# Patient Record
Sex: Female | Born: 1980 | Race: Black or African American | Hispanic: No | Marital: Single | Smoking: Current every day smoker
Health system: Southern US, Community
[De-identification: ages and names within clinical notes are randomized; demographics above are authoritative.]

## PROBLEM LIST (undated history)

## (undated) DIAGNOSIS — L98499 Non-pressure chronic ulcer of skin of other sites with unspecified severity: Secondary | ICD-10-CM

---

## 2015-07-07 ENCOUNTER — Encounter (HOSPITAL_COMMUNITY): Payer: Self-pay | Admitting: Emergency Medicine

## 2015-07-07 ENCOUNTER — Emergency Department (HOSPITAL_COMMUNITY): Payer: BLUE CROSS/BLUE SHIELD

## 2015-07-07 ENCOUNTER — Emergency Department (HOSPITAL_COMMUNITY)
Admission: EM | Admit: 2015-07-07 | Discharge: 2015-07-07 | Disposition: A | Discharge status: Home / Self Care | Payer: BLUE CROSS/BLUE SHIELD | Attending: Emergency Medicine | Admitting: Emergency Medicine

## 2015-07-07 DIAGNOSIS — J0100 Acute maxillary sinusitis, unspecified: Secondary | ICD-10-CM | POA: Insufficient documentation

## 2015-07-07 DIAGNOSIS — B349 Viral infection, unspecified: Secondary | ICD-10-CM | POA: Insufficient documentation

## 2015-07-07 DIAGNOSIS — F17200 Nicotine dependence, unspecified, uncomplicated: Secondary | ICD-10-CM | POA: Insufficient documentation

## 2015-07-07 DIAGNOSIS — E860 Dehydration: Secondary | ICD-10-CM | POA: Insufficient documentation

## 2015-07-07 DIAGNOSIS — Z3202 Encounter for pregnancy test, result negative: Secondary | ICD-10-CM | POA: Insufficient documentation

## 2015-07-07 DIAGNOSIS — R05 Cough: Secondary | ICD-10-CM | POA: Diagnosis present

## 2015-07-07 LAB — COMPREHENSIVE METABOLIC PANEL
ALT: 18 U/L (ref 14–54)
ANION GAP: 8 (ref 5–15)
AST: 20 U/L (ref 15–41)
Albumin: 3.6 g/dL (ref 3.5–5.0)
Alkaline Phosphatase: 48 U/L (ref 38–126)
BUN: 7 mg/dL (ref 6–20)
CHLORIDE: 107 mmol/L (ref 101–111)
CO2: 23 mmol/L (ref 22–32)
Calcium: 9.2 mg/dL (ref 8.9–10.3)
Creatinine, Ser: 0.59 mg/dL (ref 0.44–1.00)
Glucose, Bld: 97 mg/dL (ref 65–99)
Potassium: 3.7 mmol/L (ref 3.5–5.1)
SODIUM: 138 mmol/L (ref 135–145)
Total Bilirubin: 0.3 mg/dL (ref 0.3–1.2)
Total Protein: 7.2 g/dL (ref 6.5–8.1)

## 2015-07-07 LAB — URINALYSIS, ROUTINE W REFLEX MICROSCOPIC
BILIRUBIN URINE: NEGATIVE
GLUCOSE, UA: NEGATIVE mg/dL
KETONES UR: NEGATIVE mg/dL
LEUKOCYTES UA: NEGATIVE
Nitrite: NEGATIVE
PROTEIN: NEGATIVE mg/dL
Specific Gravity, Urine: 1.026 (ref 1.005–1.030)
pH: 6 (ref 5.0–8.0)

## 2015-07-07 LAB — POC URINE PREG, ED: PREG TEST UR: NEGATIVE

## 2015-07-07 LAB — CBC
HCT: 36.2 % (ref 36.0–46.0)
HEMOGLOBIN: 11.9 g/dL — AB (ref 12.0–15.0)
MCH: 28.2 pg (ref 26.0–34.0)
MCHC: 32.9 g/dL (ref 30.0–36.0)
MCV: 85.8 fL (ref 78.0–100.0)
Platelets: 282 10*3/uL (ref 150–400)
RBC: 4.22 MIL/uL (ref 3.87–5.11)
RDW: 13.4 % (ref 11.5–15.5)
WBC: 4.7 10*3/uL (ref 4.0–10.5)

## 2015-07-07 LAB — LIPASE, BLOOD: LIPASE: 23 U/L (ref 11–51)

## 2015-07-07 LAB — RAPID STREP SCREEN (MED CTR MEBANE ONLY): STREPTOCOCCUS, GROUP A SCREEN (DIRECT): NEGATIVE

## 2015-07-07 LAB — URINE MICROSCOPIC-ADD ON

## 2015-07-07 MED ORDER — KETOROLAC TROMETHAMINE 30 MG/ML IJ SOLN
30.0000 mg | Freq: Once | INTRAMUSCULAR | Status: AC
  Administered 2015-07-07: 30 mg via INTRAVENOUS
  Filled 2015-07-07: qty 1

## 2015-07-07 MED ORDER — ONDANSETRON HCL 4 MG/2ML IJ SOLN
4.0000 mg | Freq: Once | INTRAMUSCULAR | Status: AC
  Administered 2015-07-07: 4 mg via INTRAVENOUS
  Filled 2015-07-07: qty 2

## 2015-07-07 MED ORDER — METOCLOPRAMIDE HCL 10 MG PO TABS: 10.0000 mg | ORAL_TABLET | Freq: Four times a day (QID) | ORAL | Status: AC | PRN

## 2015-07-07 MED ORDER — FLUTICASONE PROPIONATE 50 MCG/ACT NA SUSP
2.0000 | Freq: Every day | NASAL | Status: AC
Start: 2015-07-07 — End: ?

## 2015-07-07 MED ORDER — SODIUM CHLORIDE 0.9 % IV BOLUS (SEPSIS)
1000.0000 mL | Freq: Once | INTRAVENOUS | Status: AC
  Administered 2015-07-07: 1000 mL via INTRAVENOUS

## 2015-07-07 MED ORDER — AMOXICILLIN 500 MG PO CAPS: 1000.0000 mg | ORAL_CAPSULE | Freq: Two times a day (BID) | ORAL | Status: AC

## 2015-07-07 NOTE — ED Notes (Addendum)
Pt. presents with multiple complaints : reports intermittent frontal headache , productive cough , emesis , diarrhea , generalized abdominal pain, bilateral legs aching and tingling , chills and fever.

## 2015-07-07 NOTE — ED Provider Notes (Signed)
CSN: [id number]     Arrival date & time 07/07/15  [id number] History   First MD Initiated Contact with Patient 07/07/15 [phone]     Chief Complaint  Patient presents with  . Headache  . Cough  . Emesis  . Diarrhea  . Abdominal Pain     (Consider location/radiation/quality/duration/timing/severity/associated sxs/prior Treatment) The history is provided by the patient.   Elizabeth Lyons is a 35 y.o. female here with fever, chills, myalgias. She states that she had fever and chills starting yesterday. Has temperature 102 yesterday. Also has productive cough as well as sinus congestion. She states that she has yellowish greenish nasal drainage as well. She says she has a lot of frontal headaches and this is similar to her previous sinusitis. Had an episode of vomiting yesterday as well as some diarrhea. She states that her colleagues are sick at work as well. She just moved here from [place] but has no medical problems.   History reviewed. No pertinent past medical history. History reviewed. No pertinent past surgical history. No family history on file. Social History  Substance Use Topics  . Smoking status: Current Every Day Smoker  . Smokeless tobacco: None  . Alcohol Use: Yes   OB History    No data available     Review of Systems  Respiratory: Positive for cough.   Gastrointestinal: Positive for vomiting, abdominal pain and diarrhea.  Neurological: Positive for headaches.  All other systems reviewed and are negative.     Allergies  Review of patient's allergies indicates no known allergies.  Home Medications   Prior to Admission medications   Not on File   BP 128/91 mmHg  Pulse 88  Temp(Src) 98.8 F (37.1 C) (Oral)  Resp 16  Ht 5\' 9"  (1.753 m)  Wt 175 lb (79.379 kg)  BMI 25.83 kg/m2  SpO2 99%  LMP 06/06/2015 Physical Exam  Constitutional: She is oriented to person, place, and time.  Dehydrated   HENT:  Head: Normocephalic.  MM slightly dry. + maxillary  sinus tenderness, no obvious purulent discharge from nose   Eyes: Conjunctivae are normal. Pupils are equal, round, and reactive to light.  Neck: Normal range of motion. Neck supple.  Cardiovascular: Normal rate, regular rhythm and normal heart sounds.   Pulmonary/Chest: Effort normal and breath sounds normal.  Abdominal: Soft. Bowel sounds are normal. She exhibits no distension. There is no tenderness. There is no rebound.  Musculoskeletal: Normal range of motion.  Neurological: She is alert and oriented to person, place, and time.  Skin: Skin is warm.  Psychiatric: She has a normal mood and affect. Her behavior is normal. Thought content normal.  Nursing note and vitals reviewed.   ED Course  Procedures (including critical care time) Labs Review Labs Reviewed  CBC - Abnormal; Notable for the following:    Hemoglobin 11.9 (*)    All other components within normal limits  URINALYSIS, ROUTINE W REFLEX MICROSCOPIC (NOT AT [facility]) - Abnormal; Notable for the following:    APPearance HAZY (*)    Hgb urine dipstick TRACE (*)    All other components within normal limits  URINE MICROSCOPIC-ADD ON - Abnormal; Notable for the following:    Squamous Epithelial / LPF 6-30 (*)    Bacteria, UA FEW (*)    All other components within normal limits  RAPID STREP SCREEN (NOT AT [facility])  CULTURE, GROUP A STREP ([facility])  LIPASE, BLOOD  COMPREHENSIVE METABOLIC PANEL  POC URINE PREG, ED  Imaging Review Dg Chest 2 View  07/07/2015  CLINICAL DATA:  Productive cough with bloody sputum for 2 days EXAM: CHEST  2 VIEW COMPARISON:  None. FINDINGS: Normal heart size. Lungs clear. No pneumothorax. No pleural effusion. IMPRESSION: No active cardiopulmonary disease. Electronically Signed   By: Jolaine Click M.D.   On: 07/07/2015 10:23   I have personally reviewed and evaluated these images and lab results as part of my medical decision-making.   EKG Interpretation None      MDM   Final diagnoses:  None    Elizabeth Lyons is a 35 y.o. female here with cough, congestion, myalgias. Likely viral syndrome vs sinusitis. Afebrile in the ED. Neuro exam unremarkable. Abdomen nontender. Will get labs, UA, CXR. Will hydrate and reassess.   11:29 AM Labs and UA and CXR and strep neg. Felt better. Has viral vs early bacterial sinusitis. Will give amoxicillin empirically and try flonase. She has flu exposure at work but is afebrile. Since she has nausea and vomiting I told her that she is not a good candidate for tamiflu. Will dc home.      Richardean Canal, MD 07/07/15 1131

## 2015-07-07 NOTE — ED Notes (Signed)
Fever chills aching started yesterday abd pain  Legs aching sore throat has been taking OTC meds it has not helped

## 2015-07-07 NOTE — Discharge Instructions (Signed)
Take amoxicillin twice daily for a week.   Try flonase daily for sinus congestion   Stay hydrated.   Take reglan for nausea.   Follow up with a primary care doctor   Return to ER if you have fever for a week, dehydration, vomiting, trouble breathing.

## 2015-07-09 LAB — CULTURE, GROUP A STREP (THRC)

## 2015-09-13 ENCOUNTER — Emergency Department (HOSPITAL_COMMUNITY)
Admission: EM | Admit: 2015-09-13 | Discharge: 2015-09-13 | Disposition: A | Discharge status: Home / Self Care | Payer: BLUE CROSS/BLUE SHIELD | Attending: Emergency Medicine | Admitting: Emergency Medicine

## 2015-09-13 ENCOUNTER — Encounter (HOSPITAL_COMMUNITY): Payer: Self-pay | Admitting: Emergency Medicine

## 2015-09-13 DIAGNOSIS — Z3202 Encounter for pregnancy test, result negative: Secondary | ICD-10-CM | POA: Diagnosis not present

## 2015-09-13 DIAGNOSIS — R1013 Epigastric pain: Secondary | ICD-10-CM

## 2015-09-13 DIAGNOSIS — R197 Diarrhea, unspecified: Secondary | ICD-10-CM | POA: Insufficient documentation

## 2015-09-13 DIAGNOSIS — F17200 Nicotine dependence, unspecified, uncomplicated: Secondary | ICD-10-CM | POA: Diagnosis not present

## 2015-09-13 DIAGNOSIS — K279 Peptic ulcer, site unspecified, unspecified as acute or chronic, without hemorrhage or perforation: Secondary | ICD-10-CM

## 2015-09-13 HISTORY — DX: Non-pressure chronic ulcer of skin of other sites with unspecified severity: L98.499

## 2015-09-13 LAB — URINALYSIS, ROUTINE W REFLEX MICROSCOPIC
Bilirubin Urine: NEGATIVE
GLUCOSE, UA: NEGATIVE mg/dL
Hgb urine dipstick: NEGATIVE
KETONES UR: NEGATIVE mg/dL
LEUKOCYTES UA: NEGATIVE
Nitrite: NEGATIVE
PH: 6 (ref 5.0–8.0)
Protein, ur: NEGATIVE mg/dL
SPECIFIC GRAVITY, URINE: 1.019 (ref 1.005–1.030)

## 2015-09-13 LAB — CBC WITH DIFFERENTIAL/PLATELET
BASOS ABS: 0.1 10*3/uL (ref 0.0–0.1)
Basophils Relative: 1 %
Eosinophils Absolute: 0.3 10*3/uL (ref 0.0–0.7)
Eosinophils Relative: 6 %
HCT: 37.5 % (ref 36.0–46.0)
HEMOGLOBIN: 12.1 g/dL (ref 12.0–15.0)
LYMPHS ABS: 2.3 10*3/uL (ref 0.7–4.0)
LYMPHS PCT: 44 %
MCH: 27.9 pg (ref 26.0–34.0)
MCHC: 32.3 g/dL (ref 30.0–36.0)
MCV: 86.4 fL (ref 78.0–100.0)
Monocytes Absolute: 0.3 10*3/uL (ref 0.1–1.0)
Monocytes Relative: 7 %
NEUTROS PCT: 42 %
Neutro Abs: 2.2 10*3/uL (ref 1.7–7.7)
PLATELETS: 285 10*3/uL (ref 150–400)
RBC: 4.34 MIL/uL (ref 3.87–5.11)
RDW: 14 % (ref 11.5–15.5)
WBC: 5.1 10*3/uL (ref 4.0–10.5)

## 2015-09-13 LAB — COMPREHENSIVE METABOLIC PANEL
ALBUMIN: 3.6 g/dL (ref 3.5–5.0)
ALT: 13 U/L — AB (ref 14–54)
AST: 16 U/L (ref 15–41)
Alkaline Phosphatase: 44 U/L (ref 38–126)
Anion gap: 7 (ref 5–15)
BILIRUBIN TOTAL: 0.5 mg/dL (ref 0.3–1.2)
BUN: 8 mg/dL (ref 6–20)
CHLORIDE: 105 mmol/L (ref 101–111)
CO2: 24 mmol/L (ref 22–32)
CREATININE: 0.58 mg/dL (ref 0.44–1.00)
Calcium: 9.1 mg/dL (ref 8.9–10.3)
GFR calc Af Amer: >60 mL/min (ref >60)
GLUCOSE: 91 mg/dL (ref 65–99)
POTASSIUM: 4.4 mmol/L (ref 3.5–5.1)
Sodium: 136 mmol/L (ref 135–145)
Total Protein: 6.7 g/dL (ref 6.5–8.1)

## 2015-09-13 LAB — LIPASE, BLOOD: LIPASE: 18 U/L (ref 11–51)

## 2015-09-13 LAB — POC URINE PREG, ED: Preg Test, Ur: NEGATIVE

## 2015-09-13 MED ORDER — PANTOPRAZOLE SODIUM 40 MG IV SOLR
40.0000 mg | Freq: Once | INTRAVENOUS | Status: AC
  Administered 2015-09-13: 40 mg via INTRAVENOUS
  Filled 2015-09-13: qty 40

## 2015-09-13 MED ORDER — MORPHINE SULFATE (PF) 4 MG/ML IV SOLN
4.0000 mg | INTRAVENOUS | Status: DC | PRN
  Administered 2015-09-13: 4 mg via INTRAVENOUS
  Filled 2015-09-13: qty 1

## 2015-09-13 MED ORDER — SUCRALFATE 1 G PO TABS: 1.0000 g | ORAL_TABLET | Freq: Four times a day (QID) | ORAL | Status: AC

## 2015-09-13 MED ORDER — ONDANSETRON 4 MG PO TBDP: 4.0000 mg | ORAL_TABLET | Freq: Three times a day (TID) | ORAL | Status: AC | PRN

## 2015-09-13 MED ORDER — HYDROCODONE-ACETAMINOPHEN 5-325 MG PO TABS: 2.0000 | ORAL_TABLET | ORAL | Status: AC | PRN

## 2015-09-13 MED ORDER — MORPHINE SULFATE (PF) 4 MG/ML IV SOLN: 4.0000 mg | Freq: Once | INTRAVENOUS | Status: DC

## 2015-09-13 MED ORDER — OMEPRAZOLE 20 MG PO CPDR: 20.0000 mg | DELAYED_RELEASE_CAPSULE | Freq: Two times a day (BID) | ORAL | Status: AC

## 2015-09-13 MED ORDER — ONDANSETRON HCL 4 MG/2ML IJ SOLN
4.0000 mg | Freq: Once | INTRAMUSCULAR | Status: AC
  Administered 2015-09-13: 4 mg via INTRAVENOUS
  Filled 2015-09-13: qty 2

## 2015-09-13 NOTE — ED Provider Notes (Signed)
CSN: [id number]     Arrival date & time 09/13/15  [id number] History   First MD Initiated Contact with Patient 09/13/15 [phone]     Chief Complaint  Patient presents with  . Abdominal Pain      HPI  She presents for evaluation of epigastric pain. States is been for 3-4 days. Started more slightly to the left in her upper abdomen now primarily epigastric. No back or flank pain. No urinary symptoms. Has food intolerance to almost anything she takes. Historically has not had fatty food intolerance or history of gallbladder disease. Has had an ulcer in the past diagnosed with endoscopy. States this is many years ago. Has not been on treatment for this for "a while". Last bowel movement was yesterday somewhat loose but no bladder dark or black. Mild nausea but no vomiting.  Past Medical History  Diagnosis Date  . Ulcer of abdomen wall (HCC)    History reviewed. No pertinent past surgical history. History reviewed. No pertinent family history. Social History  Substance Use Topics  . Smoking status: Current Every Day Smoker  . Smokeless tobacco: None  . Alcohol Use: Yes   OB History    No data available     Review of Systems  Constitutional: Negative for fever, chills, diaphoresis, appetite change and fatigue.  HENT: Negative for mouth sores, sore throat and trouble swallowing.   Eyes: Negative for visual disturbance.  Respiratory: Negative for cough, chest tightness, shortness of breath and wheezing.   Cardiovascular: Negative for chest pain.  Gastrointestinal: Positive for nausea, abdominal pain and diarrhea. Negative for vomiting and abdominal distention.  Endocrine: Negative for polydipsia, polyphagia and polyuria.  Genitourinary: Negative for dysuria, frequency and hematuria.  Musculoskeletal: Negative for gait problem.  Skin: Negative for color change, pallor and rash.  Neurological: Negative for dizziness, syncope, light-headedness and headaches.  Hematological: Does not bruise/bleed  easily.  Psychiatric/Behavioral: Negative for behavioral problems and confusion.      Allergies  Review of patient's allergies indicates no known allergies.  Home Medications   Prior to Admission medications   Medication Sig Start Date End Date Taking? Authorizing Provider  acetaminophen (TYLENOL) 325 MG tablet Take 650 mg by mouth every 6 (six) hours as needed for mild pain.   Yes Historical Provider, MD  amoxicillin (AMOXIL) 500 MG capsule Take 2 capsules (1,000 mg total) by mouth 2 (two) times daily. Patient not taking: Reported on 09/13/2015 07/07/15   Richardean Canalavid H Yao, MD  fluticasone [facility](FLONASE) 50 MCG/ACT nasal spray Place 2 sprays into both nostrils daily. Patient not taking: Reported on 09/13/2015 07/07/15   Richardean Canalavid H Yao, MD  HYDROcodone-acetaminophen (NORCO/VICODIN) 5-325 MG tablet Take 2 tablets by mouth every 4 (four) hours as needed. 09/13/15   Rolland PorterMark Iori Gigante, MD  metoCLOPramide (REGLAN) 10 MG tablet Take 1 tablet (10 mg total) by mouth every 6 (six) hours as needed for nausea (nausea/headache). Patient not taking: Reported on 09/13/2015 07/07/15   Richardean Canalavid H Yao, MD  omeprazole (PRILOSEC) 20 MG capsule Take 1 capsule (20 mg total) by mouth 2 (two) times daily. 09/13/15   Rolland PorterMark Yehonatan Grandison, MD  ondansetron (ZOFRAN ODT) 4 MG disintegrating tablet Take 1 tablet (4 mg total) by mouth every 8 (eight) hours as needed for nausea. 09/13/15   Rolland PorterMark Sudiksha Victor, MD  sucralfate (CARAFATE) 1 g tablet Take 1 tablet (1 g total) by mouth 4 (four) times daily. 09/13/15   Rolland PorterMark Jahnyla Parrillo, MD   BP 122/72 mmHg  Pulse 74  Resp 12  SpO2 100% Physical Exam  Constitutional: She is oriented to person, place, and time. She appears well-developed and well-nourished. No distress.  HENT:  Head: Normocephalic.  Eyes: Conjunctivae are normal. Pupils are equal, round, and reactive to light. No scleral icterus.  Conjunctiva not pale. No scleral icterus.  Neck: Normal range of motion. Neck supple. No thyromegaly present.  Cardiovascular:  Normal rate and regular rhythm.  Exam reveals no gallop and no friction rub.   No murmur heard. Pulmonary/Chest: Effort normal and breath sounds normal. No respiratory distress. She has no wheezes. She has no rales.  Abdominal: Soft. Bowel sounds are normal. She exhibits no distension. There is tenderness in the epigastric area. There is no rebound.    Musculoskeletal: Normal range of motion.  Neurological: She is alert and oriented to person, place, and time.  Skin: Skin is warm and dry. No rash noted.  Psychiatric: She has a normal mood and affect. Her behavior is normal.    ED Course  Procedures (including critical care time) Labs Review Labs Reviewed  COMPREHENSIVE METABOLIC PANEL - Abnormal; Notable for the following:    ALT 13 (*)    All other components within normal limits  URINALYSIS, ROUTINE W REFLEX MICROSCOPIC (NOT AT [facility])  CBC WITH DIFFERENTIAL/PLATELET  LIPASE, BLOOD  POC URINE PREG, ED    Imaging Review No results found. I have personally reviewed and evaluated these images and lab results as part of my medical decision-making.   EKG Interpretation None      MDM   Final diagnoses:  Epigastric pain  PUD (peptic ulcer disease)    Reassuring labs. Her pain is improved but did not resolve. Given a second dose of some pain medication. Her abdomen overall is a benign exam. I'm not concerned that this represents peritoneal irritation. Not reminiscent of biliary disease and no localizing right upper quadrant pain. History of ulcer. Has risk for recurrent ulcer. I think this is likely the culprit. She is not showing signs of bleeding or anemia. Discussed warning signs such as hematemesis bloody or black stools weakness worsening symptoms. Avoid alcohol tobacco caffeine anti-inflammatories. Proton pump inhibitor Carafate pain control and GI follow-up.    Rolland Porter, MD 09/13/15 [phone]

## 2015-09-13 NOTE — ED Notes (Signed)
Sharpe abd pain x 3 days ago upper abd pain had some nausea and diarrhea, last bm yesterday diarrhea, denies dysuria, has had tubal

## 2015-09-13 NOTE — ED Notes (Signed)
PT reports she began having abdominal pain 3 days ago. PAin started in LUQ and moved to epigastric area. PT reports pain is sharp and intermittent. PT had two loose stools yesterday. PT was nauseated at onset of pain, but that has resolved. PT reports a history of gastric ulcers.

## 2015-09-13 NOTE — Discharge Instructions (Signed)
No caffeine, alcohol, tobacco, or NSAID medicine such as aspirin or Motrin. Small frequent meals. Call GI for follow-up if not improving in the next 7 days. Return to ER with worsening, blood in your stools, vomiting blood, or any new or changing symptoms not discussed today.   Abdominal Pain, Adult Many things can cause abdominal pain. Usually, abdominal pain is not caused by a disease and will improve without treatment. It can often be observed and treated at home. Your health care provider will do a physical exam and possibly order blood tests and X-rays to help determine the seriousness of your pain. However, in many cases, more time must pass before a clear cause of the pain can be found. Before that point, your health care provider may not know if you need more testing or further treatment. HOME CARE INSTRUCTIONS Monitor your abdominal pain for any changes. The following actions may help to alleviate any discomfort you are experiencing:  Only take over-the-counter or prescription medicines as directed by your health care provider.  Do not take laxatives unless directed to do so by your health care provider.  Try a clear liquid diet (broth, tea, or water) as directed by your health care provider. Slowly move to a bland diet as tolerated. SEEK MEDICAL CARE IF:  You have unexplained abdominal pain.  You have abdominal pain associated with nausea or diarrhea.  You have pain when you urinate or have a bowel movement.  You experience abdominal pain that wakes you in the night.  You have abdominal pain that is worsened or improved by eating food.  You have abdominal pain that is worsened with eating fatty foods.  You have a fever. SEEK IMMEDIATE MEDICAL CARE IF:  Your pain does not go away within 2 hours.  You keep throwing up (vomiting).  Your pain is felt only in portions of the abdomen, such as the right side or the left lower portion of the abdomen.  You pass bloody or black  tarry stools. MAKE SURE YOU:  Understand these instructions.  Will watch your condition.  Will get help right away if you are not doing well or get worse.   This information is not intended to replace advice given to you by your health care provider. Make sure you discuss any questions you have with your health care provider.   Document Released: 01/16/2005 Document Revised: 12/28/2014 Document Reviewed: 12/16/2012 Elsevier Interactive Patient Education 2016 [employer]  Peptic Ulcer A peptic ulcer is a sore in the lining of your esophagus (esophageal ulcer), stomach (gastric ulcer), or in the first part of your small intestine (duodenal ulcer). The ulcer causes erosion into the deeper tissue. CAUSES  Normally, the lining of the stomach and the small intestine protects itself from the acid that digests food. The protective lining can be damaged by:  An infection caused by a bacterium called Helicobacter pylori (H. pylori).  Regular use of nonsteroidal anti-inflammatory drugs (NSAIDs), such as ibuprofen or aspirin.  Smoking tobacco. Other risk factors include being older than 50, drinking alcohol excessively, and having a family history of ulcer disease.  SYMPTOMS   Burning pain or gnawing in the area between the chest and the belly button.  Heartburn.  Nausea and vomiting.  Bloating. The pain can be worse on an empty stomach and at night. If the ulcer results in bleeding, it can cause:  Black, tarry stools.  Vomiting of bright red blood.  Vomiting of coffee-ground-looking materials. DIAGNOSIS  A diagnosis is  usually made based upon your history and an exam. Other tests and procedures may be performed to find the cause of the ulcer. Finding a cause will help determine the best treatment. Tests and procedures may include:  Blood tests, stool tests, or breath tests to check for the bacterium H. pylori.  An upper gastrointestinal (GI) series of the esophagus, stomach, and  small intestine.  An endoscopy to examine the esophagus, stomach, and small intestine.  A biopsy. TREATMENT  Treatment may include:  Eliminating the cause of the ulcer, such as smoking, NSAIDs, or alcohol.  Medicines to reduce the amount of acid in your digestive tract.  Antibiotic medicines if the ulcer is caused by the H. pylori bacterium.  An upper endoscopy to treat a bleeding ulcer.  Surgery if the bleeding is severe or if the ulcer created a hole somewhere in the digestive system. HOME CARE INSTRUCTIONS   Avoid tobacco, alcohol, and caffeine. Smoking can increase the acid in the stomach, and continued smoking will impair the healing of ulcers.  Avoid foods and drinks that seem to cause discomfort or aggravate your ulcer.  Only take medicines as directed by your caregiver. Do not substitute over-the-counter medicines for prescription medicines without talking to your caregiver.  Keep any follow-up appointments and tests as directed. SEEK MEDICAL CARE IF:   Your do not improve within 7 days of starting treatment.  You have ongoing indigestion or heartburn. SEEK IMMEDIATE MEDICAL CARE IF:   You have sudden, sharp, or persistent abdominal pain.  You have bloody or dark black, tarry stools.  You vomit blood or vomit that looks like coffee grounds.  You become light-headed, weak, or feel faint.  You become sweaty or clammy. MAKE SURE YOU:   Understand these instructions.  Will watch your condition.  Will get help right away if you are not doing well or get worse.   This information is not intended to replace advice given to you by your health care provider. Make sure you discuss any questions you have with your health care provider.   Document Released: 04/05/2000 Document Revised: 04/29/2014 Document Reviewed: 11/06/2011 Elsevier Interactive Patient Education [employer]

## 2015-09-13 NOTE — ED Notes (Signed)
MD at bedside. 

## 2016-10-25 IMAGING — CR DG CHEST 2V
2 series · 2 of 2 positions shown · non-contrast
Comparison: None.

CLINICAL DATA: Productive cough with bloody sputum for 2 days

EXAM:
CHEST  2 VIEW

[chest pa]
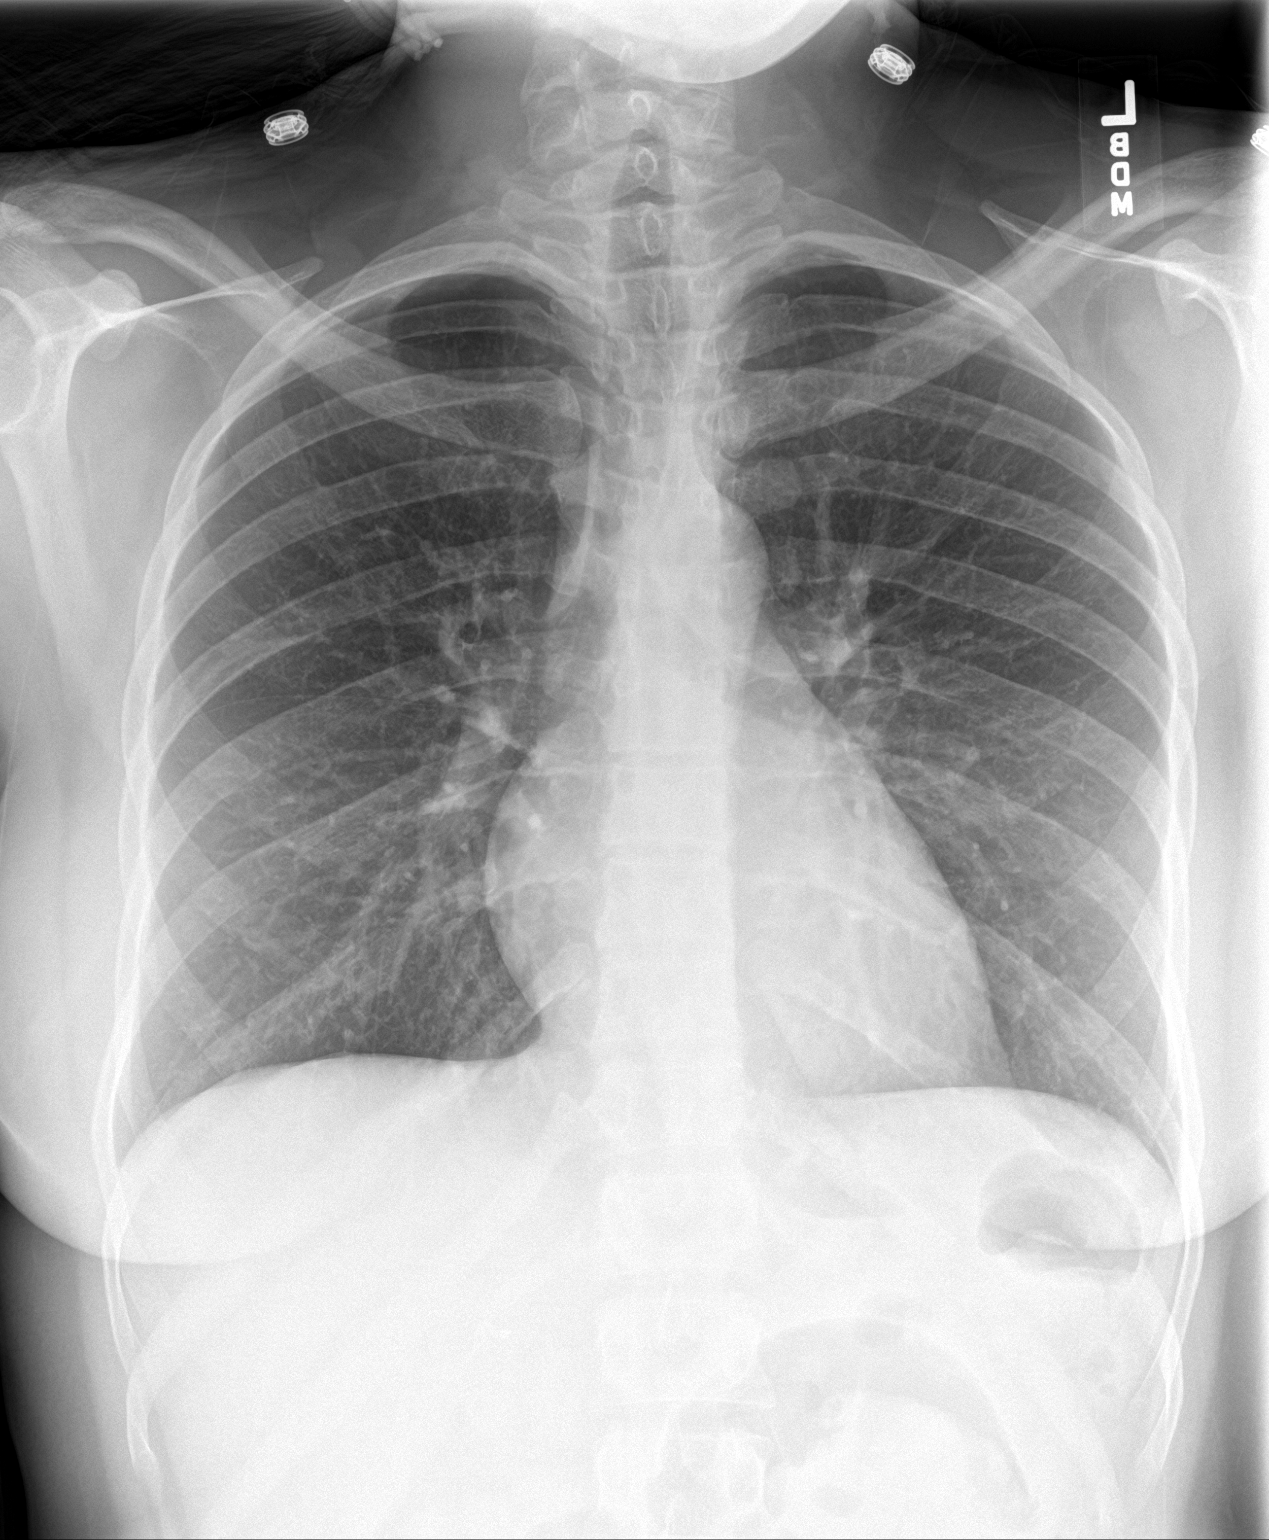

[chest lat]
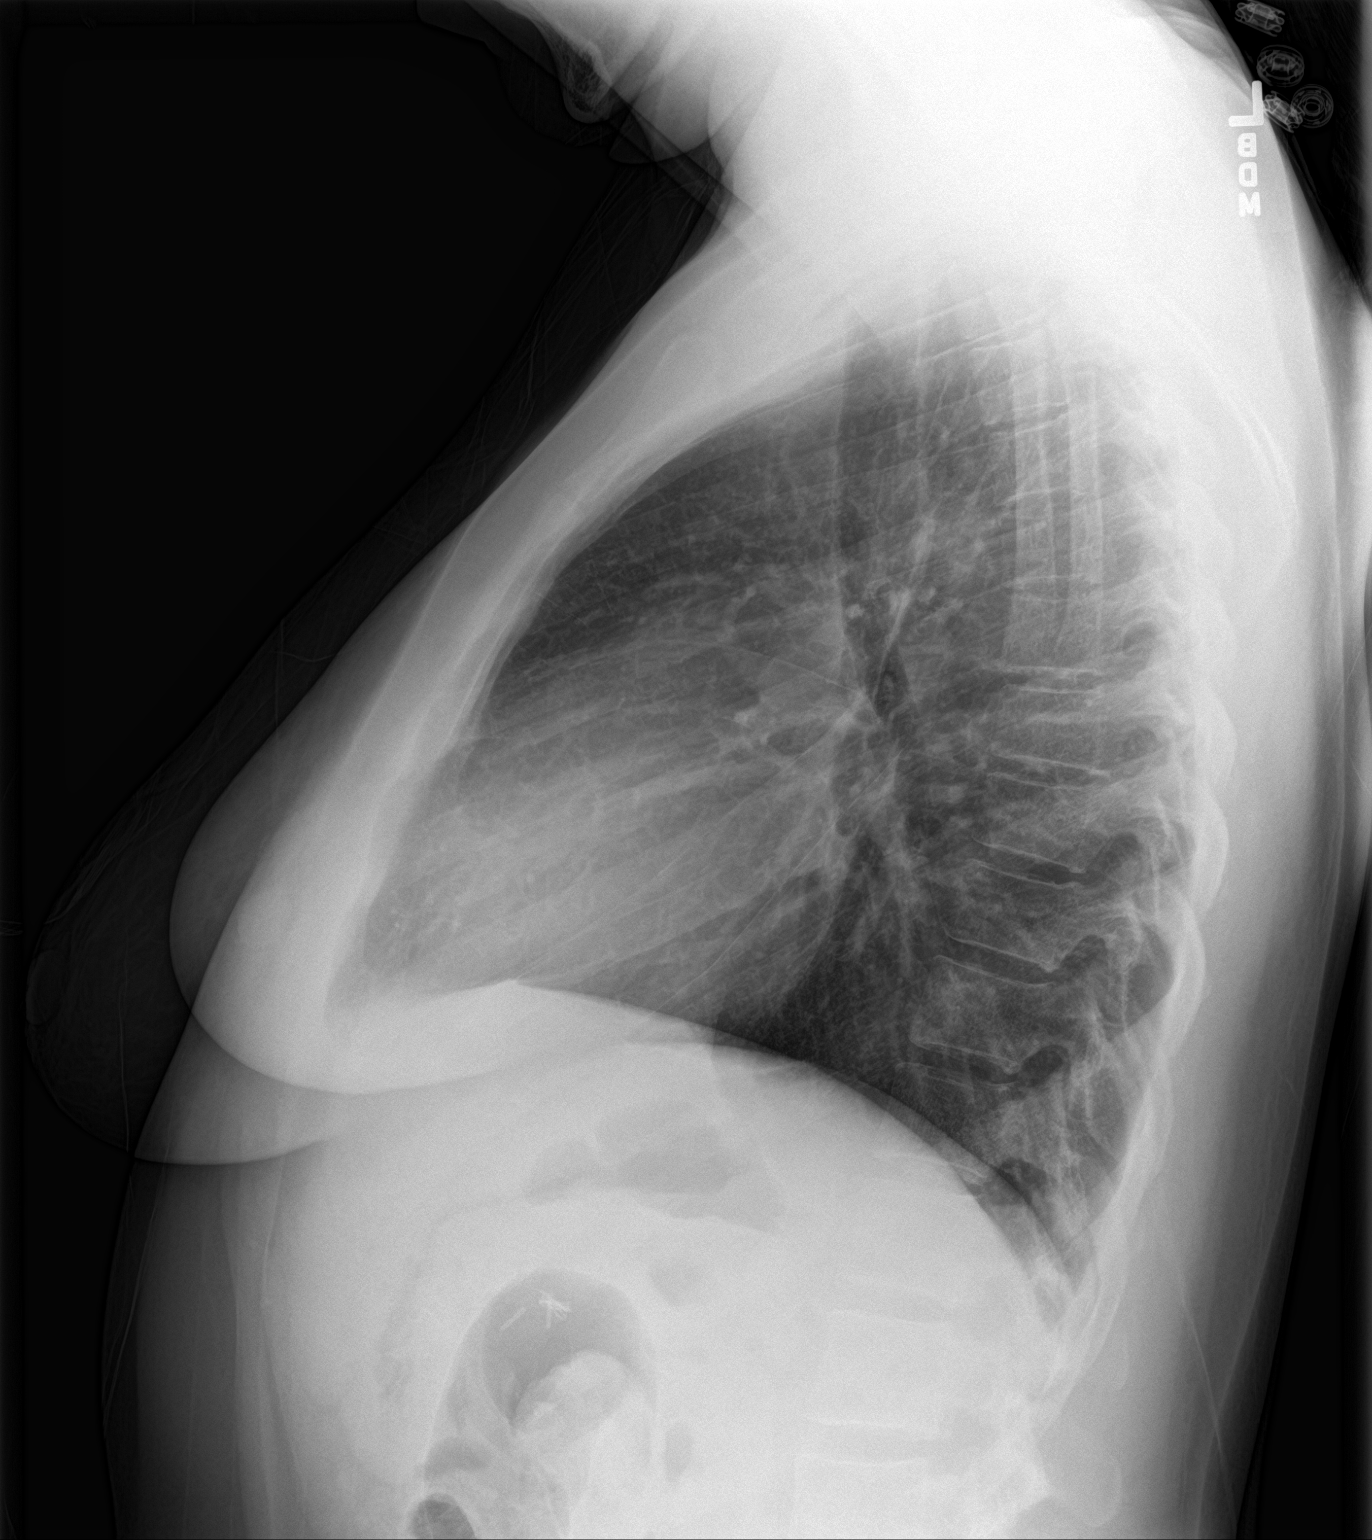

[2 of 2 positions shown; findings below may reference images not displayed]

FINDINGS: Normal heart size. Lungs clear. No pneumothorax. No pleural
effusion.
IMPRESSION: No active cardiopulmonary disease.
